# Patient Record
Sex: Male | Born: 1945 | Race: White | Hispanic: No | Marital: Married | Smoking: Never smoker
Health system: Southern US, Community
[De-identification: ages and names within clinical notes are randomized; demographics above are authoritative.]

## PROBLEM LIST (undated history)

## (undated) HISTORY — PX: TOTAL HIP ARTHROPLASTY: SHX124

---

## 2001-08-14 ENCOUNTER — Encounter: Payer: Self-pay | Admitting: Orthopedic Surgery

## 2001-08-19 ENCOUNTER — Encounter: Payer: Self-pay | Admitting: Orthopedic Surgery

## 2001-08-19 ENCOUNTER — Inpatient Hospital Stay (HOSPITAL_COMMUNITY): Admission: RE | Admit: 2001-08-19 | Discharge: 2001-08-23 | Payer: Self-pay | Admitting: Orthopedic Surgery

## 2004-12-19 ENCOUNTER — Ambulatory Visit (HOSPITAL_COMMUNITY): Admission: RE | Admit: 2004-12-19 | Discharge: 2004-12-19 | Payer: Self-pay | Admitting: *Deleted

## 2006-06-01 ENCOUNTER — Emergency Department (HOSPITAL_COMMUNITY): Admission: EM | Admit: 2006-06-01 | Discharge: 2006-06-01 | Payer: Self-pay | Admitting: Emergency Medicine

## 2007-02-19 ENCOUNTER — Ambulatory Visit: Payer: Self-pay | Admitting: Cardiology

## 2007-02-19 ENCOUNTER — Observation Stay (HOSPITAL_COMMUNITY): Admission: EM | Admit: 2007-02-19 | Discharge: 2007-02-20 | Payer: Self-pay | Admitting: Emergency Medicine

## 2007-02-20 ENCOUNTER — Ambulatory Visit: Payer: Self-pay

## 2007-02-20 ENCOUNTER — Encounter (INDEPENDENT_AMBULATORY_CARE_PROVIDER_SITE_OTHER): Payer: Self-pay | Admitting: Internal Medicine

## 2009-07-05 ENCOUNTER — Inpatient Hospital Stay (HOSPITAL_COMMUNITY): Admission: RE | Admit: 2009-07-05 | Discharge: 2009-07-09 | Payer: Self-pay | Admitting: Orthopedic Surgery

## 2009-07-05 ENCOUNTER — Ambulatory Visit: Payer: Self-pay | Admitting: Cardiology

## 2009-07-08 ENCOUNTER — Encounter: Payer: Self-pay | Admitting: Cardiology

## 2010-08-28 LAB — CBC
HCT: 30.5 % — ABNORMAL LOW (ref 39.0–52.0)
HCT: 31.7 % — ABNORMAL LOW (ref 39.0–52.0)
HCT: 39.1 % (ref 39.0–52.0)
Hemoglobin: 10.5 g/dL — ABNORMAL LOW (ref 13.0–17.0)
Hemoglobin: 10.6 g/dL — ABNORMAL LOW (ref 13.0–17.0)
MCHC: 33.2 g/dL (ref 30.0–36.0)
MCHC: 33.8 g/dL (ref 30.0–36.0)
MCV: 87.5 fL (ref 78.0–100.0)
MCV: 87.7 fL (ref 78.0–100.0)
MCV: 88 fL (ref 78.0–100.0)
MCV: 88.2 fL (ref 78.0–100.0)
Platelets: 221 10*3/uL (ref 150–400)
RBC: 3.49 MIL/uL — ABNORMAL LOW (ref 4.22–5.81)
RBC: 3.6 MIL/uL — ABNORMAL LOW (ref 4.22–5.81)
RBC: 4.45 MIL/uL (ref 4.22–5.81)
RDW: 15.5 % (ref 11.5–15.5)
WBC: 5.5 10*3/uL (ref 4.0–10.5)
WBC: 7.1 10*3/uL (ref 4.0–10.5)

## 2010-08-28 LAB — BASIC METABOLIC PANEL
CO2: 29 mEq/L (ref 19–32)
CO2: 30 mEq/L (ref 19–32)
Chloride: 101 mEq/L (ref 96–112)
Chloride: 102 mEq/L (ref 96–112)
Creatinine, Ser: 1.09 mg/dL (ref 0.4–1.5)
GFR calc Af Amer: >60 mL/min (ref >60)
GFR calc non Af Amer: >60 mL/min (ref >60)
Glucose, Bld: 109 mg/dL — ABNORMAL HIGH (ref 70–99)
Glucose, Bld: 126 mg/dL — ABNORMAL HIGH (ref 70–99)
Potassium: 4.1 mEq/L (ref 3.5–5.1)
Sodium: 138 mEq/L (ref 135–145)

## 2010-08-28 LAB — PROTIME-INR
INR: 1.07 (ref 0.00–1.49)
Prothrombin Time: 17.3 seconds — ABNORMAL HIGH (ref 11.6–15.2)
Prothrombin Time: 26.1 seconds — ABNORMAL HIGH (ref 11.6–15.2)

## 2010-08-28 LAB — URINALYSIS, ROUTINE W REFLEX MICROSCOPIC
Bilirubin Urine: NEGATIVE
Ketones, ur: NEGATIVE mg/dL
Nitrite: NEGATIVE
Protein, ur: NEGATIVE mg/dL
Urobilinogen, UA: 1 mg/dL (ref 0.0–1.0)

## 2010-08-28 LAB — TYPE AND SCREEN: Antibody Screen: NEGATIVE

## 2010-08-28 LAB — COMPREHENSIVE METABOLIC PANEL
BUN: 16 mg/dL (ref 6–23)
CO2: 31 mEq/L (ref 19–32)
Calcium: 9.1 mg/dL (ref 8.4–10.5)
Chloride: 103 mEq/L (ref 96–112)
Creatinine, Ser: 1.09 mg/dL (ref 0.4–1.5)
GFR calc Af Amer: >60 mL/min (ref >60)
GFR calc non Af Amer: >60 mL/min (ref >60)
Total Bilirubin: 0.6 mg/dL (ref 0.3–1.2)

## 2010-08-28 LAB — APTT: aPTT: 28 seconds (ref 24–37)

## 2010-10-25 NOTE — H&P (Signed)
NAME:  Shawn Armstrong, Shawn Armstrong NO.:  [id number]   MEDICAL RECORD NO.:  [id number]          PATIENT TYPE:  EMS   LOCATION:  MAJO                         FACILITY:  [facility]   PHYSICIAN:  Ladell Pier, M.D.   DATE OF BIRTH:  02/29/1946   DATE OF ADMISSION:  02/19/2007  DATE OF DISCHARGE:                              HISTORY & PHYSICAL   CHIEF COMPLAINT:  Chest pain.   HISTORY OF PRESENT ILLNESS:  The patient is 65 year old white male that  presented to the emergency room with complaints of substernal chest pain  for the past 3 to 4 days.  He thinks the pain may radiate down his left  arm, but he is not so sure, as he also has problems with his left  rotator cuff.  No nausea or vomiting.  No diaphoresis.  No shortness of  breath.  He is not sure if it is related to food or activity.  He was  lifting some medium size weights recently, and not sure if it is related  to that.  Pain also varies with inspiration.   PAST MEDICAL HISTORY:  1. Significant for polio as a child without any subsequent sequelae.  2. Hypertension.  3. Osteoarthritis.  4. History of a left hip replacement.  5. 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  69. History of severe UTI several years ago requiring hospitalization.  7. 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   FAMILY HISTORY:  Father died at 56 with hypertension and lung cancer.  Mother is in her 18s with a history of colonic polyps.  Four brothers  and four sisters, one brother with a heart attack at age 27 years old.   SOCIAL HISTORY:  The patient has no children.  He denies any tobacco or  alcohol use.  He is married.  He works in a Naval architect at [employer].  He lives in a 2-level-story house.   MEDICATIONS:  Aceon and bisoprolol; not sure of the doses.   ALLERGIES:  No known drug allergies.   REVIEW OF SYSTEMS:  As previously stated in the HPI.   PHYSICAL EXAMINATION:  VITAL SIGNS:  Temperature  97.2, pulse 51,  respirations 20, blood pressure 130/84, pulse ox 100% on room air.  HEENT:  Head is normocephalic, atraumatic.  Pupils reactive to light.  Throat without erythema.  CARDIOVASCULAR:  Regular rate and rhythm.  No murmurs, rubs or gallops.  ABDOMEN:  Soft, nontender, nondistended.  Positive bowel sounds.  EXTREMITIES:  No edema.   LABORATORY DATA:  WBC 5.9, hemoglobin 12.4, platelets 207, MCV 90.  Cardiac enzymes negative so far.  The EKG shows a mild sinus brady.  CT  of the chest and abdomen negative.  Sodium 138, potassium 4.2, chloride  109, CO2 of 26.6, BUN 40, creatinine 1.2, glucose 95.   ASSESSMENT AND PLAN:  1. Atypical chest pain:  This pain is very atypical.  It could be      musculoskeletal, it could be gastrointestinal related, but with his  age and hypertension, it could also be cardiac related, but it      sounds pleuritic in nature.  We will, however, get cardiology to      see him and possibly do a stress test.  Could be done inpatient or      outpatient.  2. Hypertension:  Continue home medications.  3. Obesity:  Diet and exercise.  4. Anemia:  We will get an anemia panel and guaiac stools.      Ladell Pier, M.D.  Electronically Signed     NJ/MEDQ  D:  02/19/2007  T:  02/19/2007  Job:  [id number]   cc:   Massie Maroon, MD

## 2010-10-25 NOTE — Consult Note (Signed)
NAME:  Shawn, Armstrong NO.:  [id number]   MEDICAL RECORD NO.:  [id number]          PATIENT TYPE:  INP   LOCATION:  4729                         FACILITY:  [facility]   PHYSICIAN:  Jesse Sans. Wall, MD, FACCDATE OF BIRTH:  [birth year]   DATE OF CONSULTATION:  02/19/2007  DATE OF DISCHARGE:                                 CONSULTATION   HISTORY:  Shawn Armstrong is a 65 year old white male who was transported via  EMS from work to [facility] Emergency Room secondary to chest  discomfort.  He is being admitted by [facility], and we are asked to  consult.   Shawn Armstrong describes at least a 1 week history of anterior, dull,  pleuritic discomfort occurring in his chest.  This rarely occurs with  exertion, it usually lasts less than 5 minutes.  It does not radiate,  nor is it associated with nausea, vomiting, shortness of breath or  diaphoresis.  He gives it a 4 on a scale of zero to 10.  It is not clear  how many episodes he has had in the last week.  However, last night he  had an episode that he felt was secondary to over-eating, and he took  some antacid with some improvement.  He stated that he had a large  amount of lasagna.  These symptoms that he experienced last night are  typical for him when he over-eats.  However, this morning at work he had  the same symptoms.  An EMT checked his blood pressure, and it was  140/100, which he felt was unusually high for him.  His supervisor  called EMS, and they transported him to [facility] Emergency Room.  EMS  report is not available.   The patient also describes left arm and hand numbness that occurs while  he is sleeping at night, and it is noticeable when he wakes up, however  slowly works itself out.  He has not had this evaluated by his  orthopaedic doctor, Dr. Lequita Halt, or his primary care physician.  He also  notes difficulty raising his left arm above his head, however he denies  any of these symptoms associated with chest  discomfort.   ALLERGIES:  No known drug allergies.   MEDICATIONS PRIOR TO ADMISSION:  Include Aceon and __________ daily,  unknown dosages.  His wife is to be bringing these medications in.  He  also takes saw palmetto and Advil p.r.n.   PAST MEDICAL HISTORY:  Notable for GERD with negative EGD and  colonoscopy on December 19, 2004.  Osteoarthritis.  Hypertension.  At home  his blood pressure usually runs in the 120s/70s.  Polio as a child,  spinal meningitis at age 69.  Prior stress test in 2007 by his primary  care physician, and it was deemed okay.  Status post left total hip  arthroplasty in March 2003.  Second right toe amputation secondary to  trauma in 1974.   SOCIAL HISTORY:  He resides in [place] with his wife.  He does not  have children.  He works for [employer] in [employer].  He denies  any tobacco, alcohol, drug, specific diet.  He does take saw palmetto as  an herbal medication.  He does stretches and light weights.  He has been  avoiding push-ups secondary to his left shoulder.   FAMILY HISTORY:  His mother is alive at age 68, with possible diagnosis  of diabetes, history of hip fracture and colon polyps.  His father died  at age 84 of lung cancer and hypertensions.  He has 4 brothers and 4  brothers.  One brother had an MI at age 61.  He has one sister who has  had a bypass surgery at age 59, but he also relates excessive drug use  earlier in her life.   REVIEW OF SYSTEMS:  In addition to the above, notable for reading  glasses, upper partial plate, lower caps.  Right hip arthralgias.  GERD,  which he describes as a bloated feeling and positive snoring.   PHYSICAL EXAMINATION:  (Performed by Dr. Daleen Squibb).  VITAL SIGNS:  Temperature 97.2, blood pressure 130/84, pulse 51 and  regular, respirations 20 and regular.  Saturation 100% on 4 liters.  HEENT:  Unremarkable.  NECK:  Supple, without thyromegaly, JVD, adenopathy or carotid bruits.  CHEST:  Symmetrical  excursion.  Lung sounds were clear to auscultation  without rales, rhonchi, or wheezing.  HEART:  PMI is not displaced.  Regular rate and rhythm.  Normal S1 and  S2, without murmurs, rubs, clicks or gallops.  All pulses are  symmetrical and intact, without abdominal or femoral bruits.  SKIN:  Integument is intact.  ABDOMEN:  Obese.  Bowel sounds present, without organomegaly, masses or  tenderness.  EXTREMITIES:  Negative cyanosis, clubbing or edema.  MUSCULOSKELETAL:  Grossly unremarkable.  NEUROLOGIC:  Unremarkable.   Chest x-ray shows a prominent ascending aorta.  Chest, abdominal CT did  not show any evidence of pulmonary embolism, thoracic or abdominal  aortic dissection.  EKG showed sinus bradycardia, normal axis, early R-  wave, nonspecific ST-T wave changes, normal intervals, ventricular rate  51.  Old EKG is not available.  H and H 12.4 and 36.2, normal indices.  Platelets 207.  WBCs 5.9.  An I-stat showed a sodium of 138, potassium  4.2, BUN 14, creatinine 1.2, glucose 95.  Point of care markers were  negative x2.   IMPRESSION:  1. Atypical chest discomfort, doubt cardiac etiology.  He has had      negative point of care markers x2.  His EKG and CT scans are      unremarkable.  2. Hypertension.  History is noted per past medical history.  3. Probable left rotator cuff injury.   PLAN:  Agree with your admission to rule out myocardial infarction.  If  enzymes are negative for myocardial infarction, stress Myoview will be  performed in our office tomorrow on [facility] on February 20, 2007  at 11:30.  If his enzymes are abnormal, we will arrange cardiac  catheterization.  We will order fasting lipids and CMET to assess his  lipid status.  Consider outpatient orthopaedic referral with his left  shoulder problems.      Joellyn Rued, PA-C      Jesse Sans. Daleen Squibb, MD, [facility]  Electronically Signed    EW/MEDQ  D:  02/19/2007  T:  02/19/2007  Job:  [id number]   cc:   Massie Maroon, MD  Jesse Sans Daleen Squibb, MD, [facility]  Georgiana Spinner, M.D.  Ollen Gross, M.D.

## 2010-10-25 NOTE — Discharge Summary (Signed)
NAME:  KASCH, BORQUEZ NO.:  [id number]   MEDICAL RECORD NO.:  [id number]          PATIENT TYPE:  INP   LOCATION:  4729                         FACILITY:  [facility]   PHYSICIAN:  Altha Harm, MDDATE OF BIRTH:  1946/02/17   DATE OF ADMISSION:  02/19/2007  DATE OF DISCHARGE:  02/20/2007                               DISCHARGE SUMMARY   DISCHARGE DISPOSITION:  To the care of cardiologist outpatient for  outpatient stress test, and then cardiology will determine sign-out  disposition for patient.   FINAL DISCHARGE DIAGNOSES:  1. Chest pain.  2. Bradycardia.  Asymptomatic.  3. Borderline hyperlipidemia.  4. History of polio as a child without any subsequent sequela.  5. History of hypertension.  6. History of osteoarthritis.  7. History of left hip replacement.  8. Status post amputation of second right toe from trauma in 1974.   DISCHARGE MEDICATIONS:  1. Aceon 4 milligrams orally daily.  2. Saw palmetto 1 tablet orally daily.   NEW MEDICATIONS:  1. Aspirin 81 milligrams orally daily.  2. Nitroglycerin 0.4 milligrams sublingual every 5 mins  x 3 as      needed.   DISCONTINUED MEDICATIONS:  Bisoprolol 5 milligrams orally daily.   CONSULTATIONS:  [place] cardiology.   PROCEDURE:  None.   DIAGNOSTIC STUDIES:  1. A 2D echocardiogram.  Results pending.  2. CT angiogram of the abdomen and CT angiogram of the chest which      show no acute abdominal aortic dissection and no aneurysm.  3. Chest x-ray  which shows prominence in the right hilar region which      may be related to the offending aorta.   CODE STATUS:  Full code.   ALLERGIES:  No known drug allergies.   PRIMARY CARE PHYSICIAN:  Dr. Selena Batten.   CHIEF COMPLAINT:  Chest pain.   HISTORY OF PRESENT ILLNESS:  Please see history and physical dictated by  Dr. Olena Leatherwood for details of the history of present illness.   HOSPITAL COURSE:  The patient was ruled out for resting ischemia wih  serial   enzymes.  Patient was seen by cardiology who recommended that  the patient have a stress test as an outpatient.  The patient was also  noted to have bradycardia with heart rates in the 40s and 50s, but had  no associated hypotension and was ambulating without any difficulty.  The patient was seen by cardiology, and this was actually discussed by  myself with the cardiologist representative Irving Burton  , who states that in  discussion with her cardiologist, it was determined that the patient  could be discharged without waiting to have the stress test done in the  hospital, and that during the stress test, the patient's chronotropic  effects would be evaluated at that time.  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  The patient  was on a small dose of 5 milligrams.  Further treatment of bradycardia  will be determined by cardiologist after evaluation of his response to  activity during the stress test.  The  patient is otherwise stable, and  the plan is for discharge to the care of the cardiologist in the  outpatient setting for the stress Myoview.      Altha Harm, MD  Electronically Signed     MAM/MEDQ  D:  02/20/2007  T:  02/20/2007  Job:  [id number]   cc:   Massie Maroon, MD

## 2010-10-28 NOTE — Op Note (Signed)
NAME:  AMARO, MANGOLD NO.:  [id number]   MEDICAL RECORD NO.:  [id number]          PATIENT TYPE:  AMB   LOCATION:  ENDO                         [facility]ACILITY:  [facility]   PHYSICIAN:  Georgiana Spinner, M.D.    DATE OF BIRTH:  [birth year]   DATE OF PROCEDURE:  12/19/2004  DATE OF DISCHARGE:                                 OPERATIVE REPORT   PROCEDURE:  Colonoscopy.   INDICATIONS:  Colon cancer screening.   ANESTHESIA:  Demerol 20 mg and Versed 1 mg.   DESCRIPTION OF PROCEDURE:  With the patient mildly sedated in the left  lateral decubitus position, a rectal examination was performed which was  unremarkable. Subsequently the Olympus videoscopic colonoscope was inserted  into the rectum and passed under direct vision to the cecum identified by  ileocecal valve and crow's foot of the cecum. From this point the  colonoscope was slowly withdrawn, taking circumferential views of colonic  mucosa, stopping only in the rectum which appeared normal on direct and  retroflexed view. The endoscope was straightened and withdrawn. The  patient's vital signs and pulse oximeter remained stable. The patient  tolerated procedure well without apparent complication.   FINDINGS:  Unremarkable colonoscopic examination to the cecum.   PLAN:  Consider repeat examination possibly in 5-10 years       GMO/MEDQ  D:  12/19/2004  T:  12/19/2004  Job:  [id number]

## 2010-10-28 NOTE — Op Note (Signed)
[facility]  Patient:    Shawn Armstrong, Shawn Armstrong Visit Number: [id number] MRN: [id number]          Service Type: SUR Location: 4W [id number] [id number] Attending Physician:  Loanne Drilling Dictated by:   Ollen Gross, M.D. Proc. Date: 08/20/01 Admit Date:  08/19/2001                             Operative Report  PREOPERATIVE DIAGNOSIS: Osteoarthritis of the left hip.  POSTOPERATIVE DIAGNOSIS:  Osteoarthritis of the left hip.  PROCEDURE:  Left total hip arthroplasty.  SURGEON:  Ollen Gross, M.D.  ASSISTANT:  Ralene Bathe, P.A.  ANESTHESIA:  General.  ESTIMATED BLOOD LOSS:  350  DRAINS:  Hemovac x1.  COMPLICATIONS:  None.  CONDITION:  Stable to recovery.  BRIEF CLINICAL NOTE:  Shawn Armstrong is a 65 year old male with severe osteoarthritis of the left hip with pain refractory to nonoperative management. He presents now for left total hip arthroplasty.  DESCRIPTION OF PROCEDURE:  After successful administration of general anesthetic, the patient was placed in the right lateral decubitus position with the left hand side up and held with the hip positioner. The left lower extremity was isolated from his perineum with plastic drapes and prepped and draped in the usual sterile fashion. A standard posterolateral incision was made with a 10 blade through the subcutaneous tissue to the level of the fascia lata which was incised in line with the skin incision. The sciatic nerve was palpated and protected and short external rotators isolated off the femur. Capsulectomy is then performed. The hip was dislocated and there was tremendous osteophyte formation. The center of the femoral head is marked and trial prosthesis placed such that the center of the trial head corresponds with the center of his native femoral head. Osteotomy is made with an oscillating saw. The femoral head is removed. The femur is retracted anteriorly and part of the anterior capsule removed.  Acetabular exposure was obtained.  Reaming starts at a 53 coursing in increments in 2 to a 57 mm. A 58 mm pinnacle acetabular shell is then impacted into the acetabulum in his native anatomic position. Its transfixed with dome screws. The 32 mm neutral trial liner was placed.  The femur was prepared first with the canal finder and then irrigation. Axial reaming is performed to 15.5 mm. Proximal reaming is performed up to a 3F and the sleeve machine to a large. A trial 3F large sleeve is placed with the 20 x 15 stem, 36 plus 8 neck, and 32 plus 0 head. To match his native anteversion, trial reduction is performed, full extension and full external rotation achieved with 70 degrees flexion and 40 degrees adduction, 90 degrees internal rotation and 90 degrees flexion and 70 degrees internal rotation. The hip is dislocated and all trials removed. Apex hole eliminator is then placed into the acetabular shell and then the 32 mm neutral liner is placed into the shell. This was a marathon highly cross link polyethylene. The femur is then addressed with the 3F large sleeve, 20 x 15 stem with 36 plus 8 neck. The 32 plus zero head is placed. The hip is reduced with great stability throughout. The wound was copiously irrigated with antibiotic solution and short external rotators reattached to the femur through drill holes. The fascia lata was closed over a Hemovac drain with interrupted #1 Vicryl, subcu closed with #1 and 2-0 Vicryl, subcuticular running 4-0 monocryl.  The incision was cleaned and dried and Steri-Strips and a bulky sterile dressing applied. The patient was awakened and transported to recovery in stable condition. Dictated by:   Ollen Gross, M.D. Attending Physician:  Loanne Drilling DD:  08/19/01 TD:  08/19/01 Job: [id number]

## 2010-10-28 NOTE — Op Note (Signed)
NAME:  Shawn Armstrong, REIM NO.:  [id number]   MEDICAL RECORD NO.:  [id number]          PATIENT TYPE:  AMB   LOCATION:  ENDO                         FACILITY:  [facility]   PHYSICIAN:  Georgiana Spinner, M.D.    DATE OF BIRTH:  1946/02/07   DATE OF PROCEDURE:  12/19/2004  DATE OF DISCHARGE:                                 OPERATIVE REPORT   PROCEDURE:  Upper endoscopy.   INDICATIONS:  Gastroesophageal reflux disease.   ANESTHESIA:  Demerol 50, Versed 6 mg.   DESCRIPTION OF PROCEDURE:  With the patient mildly sedated in the left  lateral decubitus position, the Olympus videoscopic endoscope was inserted  in the mouth and passed under direct vision through the esophagus which  appeared normal. There was no evidence of Barrett's. We entered into the  stomach. The fundus, body, antrum, duodenal bulb, second portion of duodenum  appeared normal. From this point, the endoscope was slowly withdrawn taking  circumferential views of the duodenal mucosa until the endoscope had been  pulled back into the stomach, placed in retroflexion to view the stomach  from below and an incomplete wrap of the GE junction was noted on this view.  The endoscope was straightened and withdrawn taking circumferential views of  the remaining gastric and esophageal mucosa. The patient's vital signs and  pulse oximeter remained stable. The patient tolerated the procedure well  without apparent complications.   FINDINGS:  Loose wrap of the GE junction around the endoscope indicating  laxity of the sphincter, otherwise and unremarkable exam.   PLAN:  Proceed to colonoscopy.       GMO/MEDQ  D:  12/19/2004  T:  12/19/2004  Job:  [id number]

## 2010-10-28 NOTE — H&P (Signed)
[facility]  Patient:    Shawn Armstrong, Shawn Armstrong Visit Number: [id number] MRN: [id number]          Service Type: Attending:  Ollen Gross, M.D. Dictated by:   Dorie Rank, P.A. Adm. Date:  08/19/01   CC:         Shawn Armstrong. Shawn Armstrong., M.D.   History and Physical  DATE OF BIRTH:  1945/08/23  CHIEF COMPLAINT:  Left hip pain.  HISTORY OF PRESENT ILLNESS:  Shawn Armstrong is a pleasant 65 year old male who has a long history of left hip pain which has gotten progressively worse over the past several months.  The pain is in the groin, radiating in the medial thigh, and interferes with most activities of daily living.  It has even affected his ability to play golf.  On physical exam it was noted that the range of motion was limited.  He had flexion to 85 degrees, no internal rotation, only 10 degrees of external rotation, and 20 degrees of abduction.  Leg lengths were equal.  He walks with an abductor lurch in the office.  Radiographs revealed severe osteoarthritis to the left hip.  It was felt due to diagnostic studies as well as interference with activities of daily living and pain refractory to conservative treatment, he would benefit from undergoing a left total hip arthroplasty.  The risks and benefits as well as the procedure were discussed with the patient, and he elected to proceed.  ALLERGIES:  No known drug allergies.  MEDICATIONS: 1. Aceon 8 mg, 1 p.o. q.d. 2. ______ 5 mg, 1 p.o. q.d. 3. Tylenol p.r.n.  PAST MEDICAL HISTORY: 1. Polio as a child without any subsequent sequelae. 2. Hypertension. 3. Osteoarthritis. 4. History of spinal meningitis at age 64 without any complications after    that. 5. Severe UTI several years back which did require hospitalization.  PAST SURGICAL HISTORY:  Amputation of second right toe following trauma in 1974.  SOCIAL HISTORY:  The patient donated autologous blood for this.  He has no children.  He denies  any tobacco or alcohol use.  He is married.  He works in a Naval architect at [employer].  He lives in a two-level home.  He has a bedroom on the second floor; however, states he will place a bed on the first level. His wife will be available to care for him postoperatively.  FAMILY HISTORY:  Father deceased, age 26, history of hypertension and lung cancer.  Mother living, age 18, history of colon polyps.  REVIEW OF SYSTEMS:  GENERAL:  No fevers, chills, night sweats, or bleeding tendencies.  PULMONARY:  No shortness of breath, productive cough, or hemoptysis.  CARDIOVASCULAR:  No chest pain, angina, or orthopnea. NEUROLOGIC:  No seizures, headaches, or paralysis.  GASTROINTESTINAL:  No nausea, vomiting, diarrhea, constipation, or melena.  GENITOURINARY:  No hematuria, dysuria, or discharge.  MUSCULOSKELETAL:  Left hip pain and right epicondylitis.  PHYSICAL EXAMINATION:  GENERAL:  Alert and oriented x 3, well-developed, well-nourished 65 year old African-American male.  VITAL SIGNS:  Pulse 60, respirations 16, blood pressure 110/80.  HEENT:  He wears an upper partial.  Head atraumatic, normocephalic. Oropharynx is clear.  NECK:  Supple.  Negative for carotid bruits bilaterally.  Negative for cervical lymphadenopathy palpated on exam.  LUNGS:  Clear to auscultation bilaterally.  No wheezes, rhonchi, or rales.  BREASTS:  Not pertinent to present illness.  HEART:  S1, S2.  Negative for murmur, rub, or gallop.  Regular rate and  rhythm.  ABDOMEN:  Soft and nontender.  Positive bowel sounds.  GENITOURINARY:  Not pertinent to present illness.  EXTREMITIES:  Please see history of present illness for physical exam to the left hip.  SKIN:  Intact.  Distal pulses are 1+ and symmetrical bilaterally.  No rashes or lesions appreciated on exam.  LABORATORY DATA:  X-rays of the left hip in the office showed severe bone-on-bone contact changes with osteophyte  formations.  IMPRESSION: 1. Osteoarthritis left hip. 2. Hypertension. 3. Polio as a child.  PLAN:  The patient is scheduled for a ______ total hip arthroplasty with Dr. Ollen Gross. Dictated by:   Dorie Rank, P.A. Attending:  Ollen Gross, M.D. DD:  08/14/01 TD:  08/15/01 Job: [id number]

## 2010-10-28 NOTE — Discharge Summary (Signed)
[facility]  Patient:    Shawn Armstrong, Shawn Armstrong Visit Number: [id number] MRN: [id number]          Service Type: SUR Location: [id number] Attending Physician:  Loanne Drilling Dictated by:   Ottie Glazier Wynona Neat, P.A.-C. Admit Date:  08/19/2001 Discharge Date: 08/23/2001                             Discharge Summary  ADMITTING DIAGNOSES: 1. Left hip pain secondary to osteoarthritis. 2. History of hypertension. 3. History of osteoarthritis. 4. History of polio as a child without any subsequent sequelae. 5. 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 6. Previous urinary tract infection which required hospitalization.  DISCHARGE DIAGNOSES: 1. Left hip pain secondary to osteoarthritis, improved. 2. History of hypertension. 3. History of osteoarthritis. 4. History of polio as a child without any subsequent sequelae. 5. History of spinal meningitis at age 56 without any subsequent sequelae. 6. Previous urinary tract infection which required hospitalization.  CONSULTATIONS:  None.  HISTORY OF PRESENT ILLNESS:  Shawn Armstrong is a 65 year old, white male who had a long history of left hip pain which increased in frequency and duration.  He was seen and evaluated for Dr. Ollen Gross, [facility]. Despite conservation treatment, the patient still had debilitating and progressive pain that interfered with his activities of daily living.  Due to this chronic and ongoing pain, the patient opted to undergo surgical intervention.  Surgical intervention in the form of a left total hip arthroplasty.  LABORATORY DATA AND X-RAY FINDINGS:  Left hip x-rays in the office showed severe bone on bone contact changes with osteophyte formations.  Preoperative labs showed H&H 12.1 and 34.9.  PT/INR 13.8 and 1.1.  BMET within normal limits.  Liver functions within normal limits.  UA within normal limits.  He had blood type O  negative.  HOSPITAL COURSE:  On August 19, 2001, Mr. Dzikowski underwent a total hip arthroplasty per Dr. Ollen Gross with Ralene Bathe, P.A.-C assisting. Please see operative report for details.  On postop day #1, the patient stated that he was feeling tired especially with any bed to chair transfers, otherwise doing fairly well sitting up in a chair.  His wife was in the room with many questions concerning precautions that were discussed in great detail with the patient.  He was afebrile with vital signs stable, however, his blood pressure was 98/54.  His hemoglobin dropped from 12 to 10.9.  However, due to the patient being symptomatic and following Dr. Salina April recommendations, the patient was transfused two units of autologous blood.  Examination of his hip showed that he was neurovascularly intact.  His motor and sensory functions were intact.  The Hemovac was pulled without complications.  The patient was noted to have a drop in his sodium from 140 to 134.  Therefore, Lasix was given and his fluids were at [facility].  On postop day #2, the patient was awake and doing much better.  He stated he felt better after the transfusion having more energy, not feeling as tired and fatigued.  He denied any nausea or vomiting, no fever or chills.  Vital signs were stable.  He had a low-grade temperature of 99.2.  His H&H was 12.2 and 34.6.  INR was 1.7.  His dressing on the left hip was changed.  It was clean, dry and intact.  The calves were soft and nontender with  positive pulses.  His motor functioning was intact as well.  PCA and IV fluids were discontinued at the time.  The patient will continue with physical therapy and per their recommendations, home health PT would be sought per discharge planning.  On postop day #3, the patient was doing well.  His vital signs were stable and blood pressure was 99/52.  However, his blood pressure at the time of admission was 104/58.  He was asymptomatic.  He  was noted to be on two hypertensive medications.  His Aceon would be held for that day.  His temperature was 99.7, H&H 11.4 and 31.9.  His INR was 2.8.  Examination of the hip was within normal limits.  There were no signs or symptoms of infection. his motor and sensory functions were intact.  He was also given Protonix on postop day #3 for complaints of heartburn.  On postop day #4, the patient was resting in the bed reading the paper.  He had no complaints.  His wife was in the room and he was eager for discharge. His vital signs were stable and afebrile.  PT/INR was therapeutic at 21.4 and 2.3.  Left hip incision was clean, dry and intact.  Motor and sensory exam was intact.  CONDITION ON DISCHARGE:  Improved.  DIET:  Regular.  ACTIVITY:  Touchdown weightbearing to the left lower extremity.  Total hip precautions.  WOUND CARE:  Keep dressings clean, dry and intact.  SPECIAL INSTRUCTIONS:  The patient will be set up with Genevieve Norlander for home health services, physical therapy and Coumadin management.  DISCHARGE MEDICATIONS: 1. Percocet 5/325 one to two p.o. q.4-6h. p.r.n., #40. 2. Ambien 25 mg one p.o. q.4-6h., #40. 3. Robaxin 500 mg p.o. q.6-8h. p.r.n., #40. 4. Coumadin per pharmacy doses.  FOLLOWUP:  The patient will follow up with Dr. Lequita Halt in 10-14 days.  He is to call [phone], for appointment, questions or concerns. Dictated by:   Ottie Glazier. Wynona Neat, P.A.-C. Attending Physician:  Loanne Drilling DD:  08/27/01 TD:  08/28/01 Job: [id number]

## 2011-03-24 LAB — CBC
HCT: 34.5 — ABNORMAL LOW
Hemoglobin: 11.9 — ABNORMAL LOW
Hemoglobin: 12.4 — ABNORMAL LOW
MCHC: 34.3
MCHC: 34.3
Platelets: 211
RBC: 4.02 — ABNORMAL LOW
RDW: 13.7
WBC: 5.9

## 2011-03-24 LAB — POCT CARDIAC MARKERS
CKMB, poc: <1 — ABNORMAL LOW
Myoglobin, poc: 77.1
Troponin i, poc: <0.05
Troponin i, poc: <0.05

## 2011-03-24 LAB — VITAMIN B12: Vitamin B-12: 371 (ref 211–911)

## 2011-03-24 LAB — DIFFERENTIAL
Basophils Relative: 0
Lymphs Abs: 1.5
Monocytes Absolute: 0.4
Monocytes Relative: 7
Neutro Abs: 3.9

## 2011-03-24 LAB — COMPREHENSIVE METABOLIC PANEL
Albumin: 3.3 — ABNORMAL LOW
Alkaline Phosphatase: 84
BUN: 12
Calcium: 8.6
Glucose, Bld: 80
Potassium: 3.9
Sodium: 138
Total Protein: 6.3

## 2011-03-24 LAB — TSH: TSH: 1.267

## 2011-03-24 LAB — CARDIAC PANEL(CRET KIN+CKTOT+MB+TROPI): Total CK: 83

## 2011-03-24 LAB — LIPID PANEL
HDL: 28 — ABNORMAL LOW
LDL Cholesterol: 114 — ABNORMAL HIGH
Triglycerides: 141
VLDL: 28

## 2011-03-24 LAB — I-STAT 8, (EC8 V) (CONVERTED LAB)
Acid-Base Excess: 1
Bicarbonate: 26.6 — ABNORMAL HIGH
Glucose, Bld: 95
HCT: 39
Hemoglobin: 13.3
Operator id: 288831
Potassium: 4.2
Sodium: 138
TCO2: 28

## 2011-03-24 LAB — CK TOTAL AND CKMB (NOT AT ARMC)
Relative Index: INVALID
Total CK: 73

## 2011-03-24 LAB — FOLATE: Folate: 10.5

## 2011-03-24 LAB — IRON AND TIBC
Iron: 105
TIBC: 323
UIBC: 218

## 2011-03-24 LAB — TROPONIN I: Troponin I: 0.03

## 2011-03-24 LAB — FERRITIN: Ferritin: 12 — ABNORMAL LOW (ref 22–322)

## 2011-05-11 IMAGING — CR DG PORTABLE PELVIS
1 series · 1 of 1 positions shown · non-contrast
Comparison: 06/30/2009

CLINICAL DATA: Postop right total hip arthroplasty.

PORTABLE PELVIS

[view not recorded]
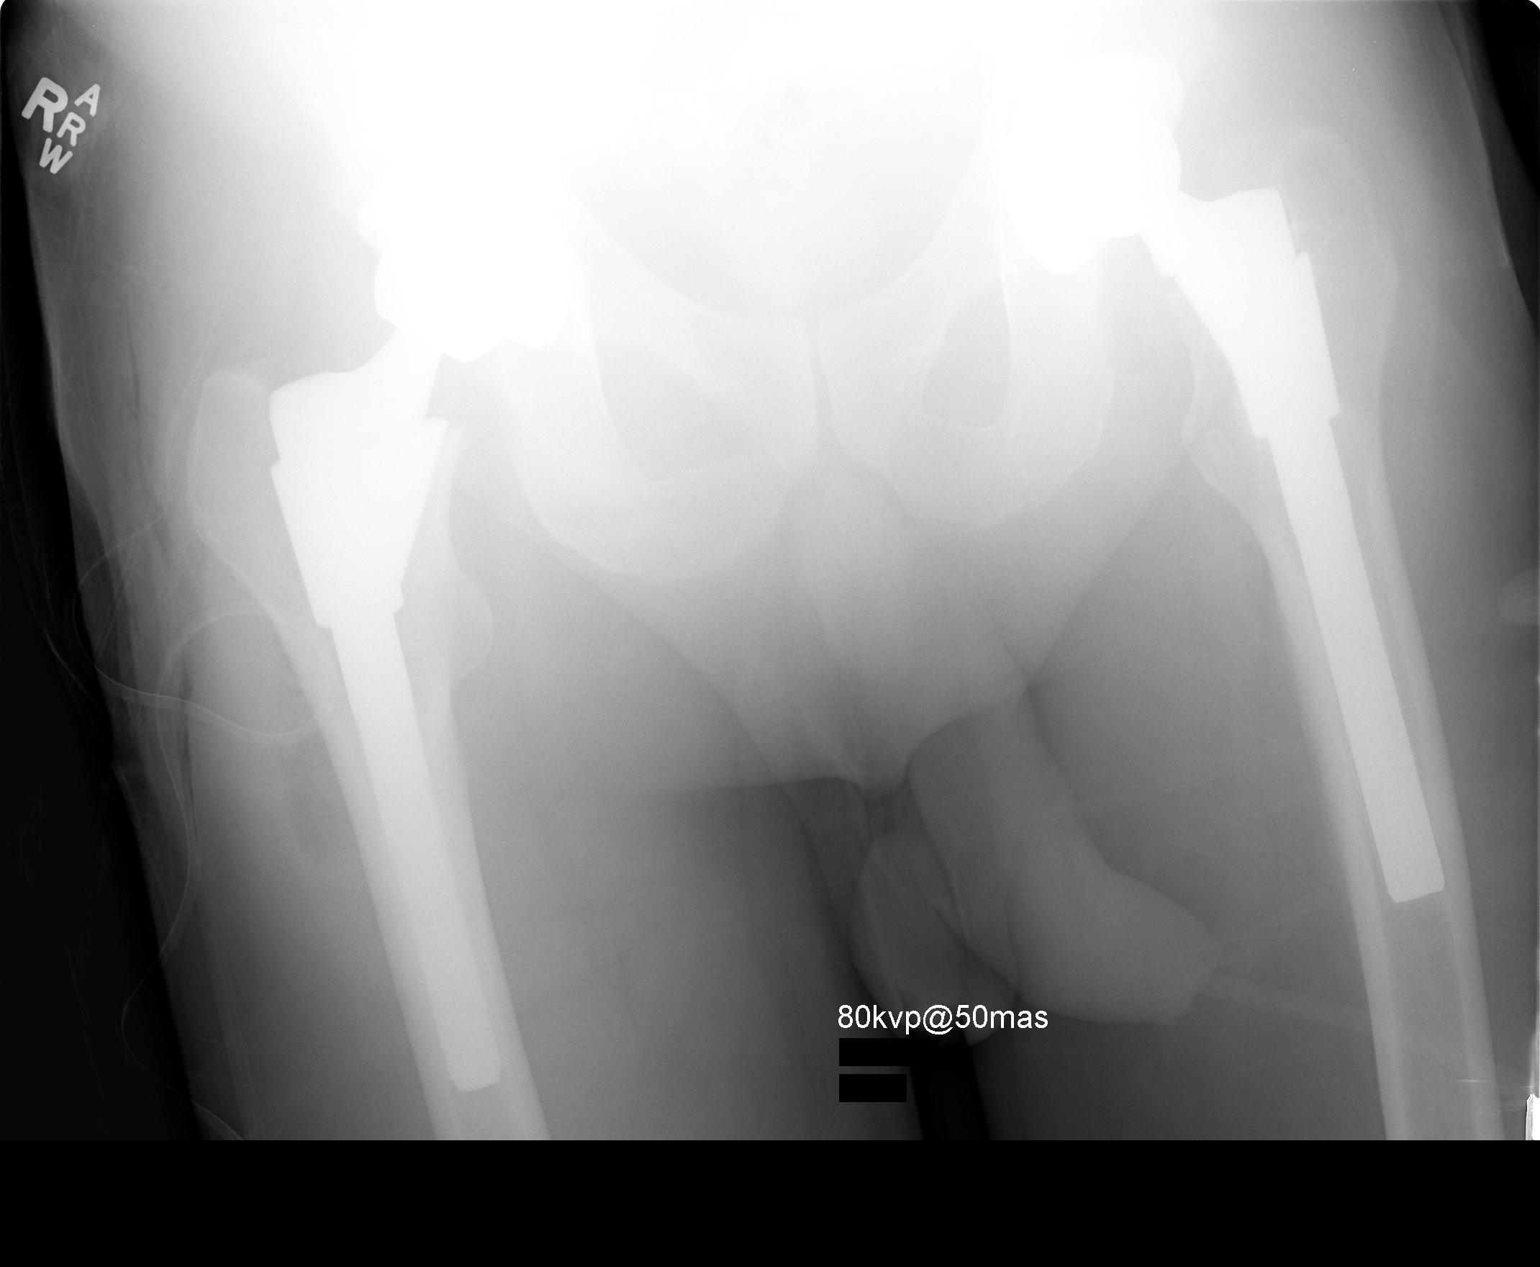

[1 of 1 positions shown; findings below may reference images not displayed]

FINDINGS: There has been interval right total hip arthroplasty.
Surgical drain is in place and there is associated subcutaneous
air.  Previous left total hip arthroplasty.
IMPRESSION: Interval right total hip arthroplasty with expected postoperative
findings.

## 2011-05-14 IMAGING — CR DG CHEST 1V PORT
1 series · 1 of 1 positions shown · non-contrast
Comparison: 06/30/2009.  CT of 1 day prior.

CLINICAL DATA: Postop for right hip replacement.  Congestion and
cough.

PORTABLE CHEST - 1 VIEW

[view not recorded]
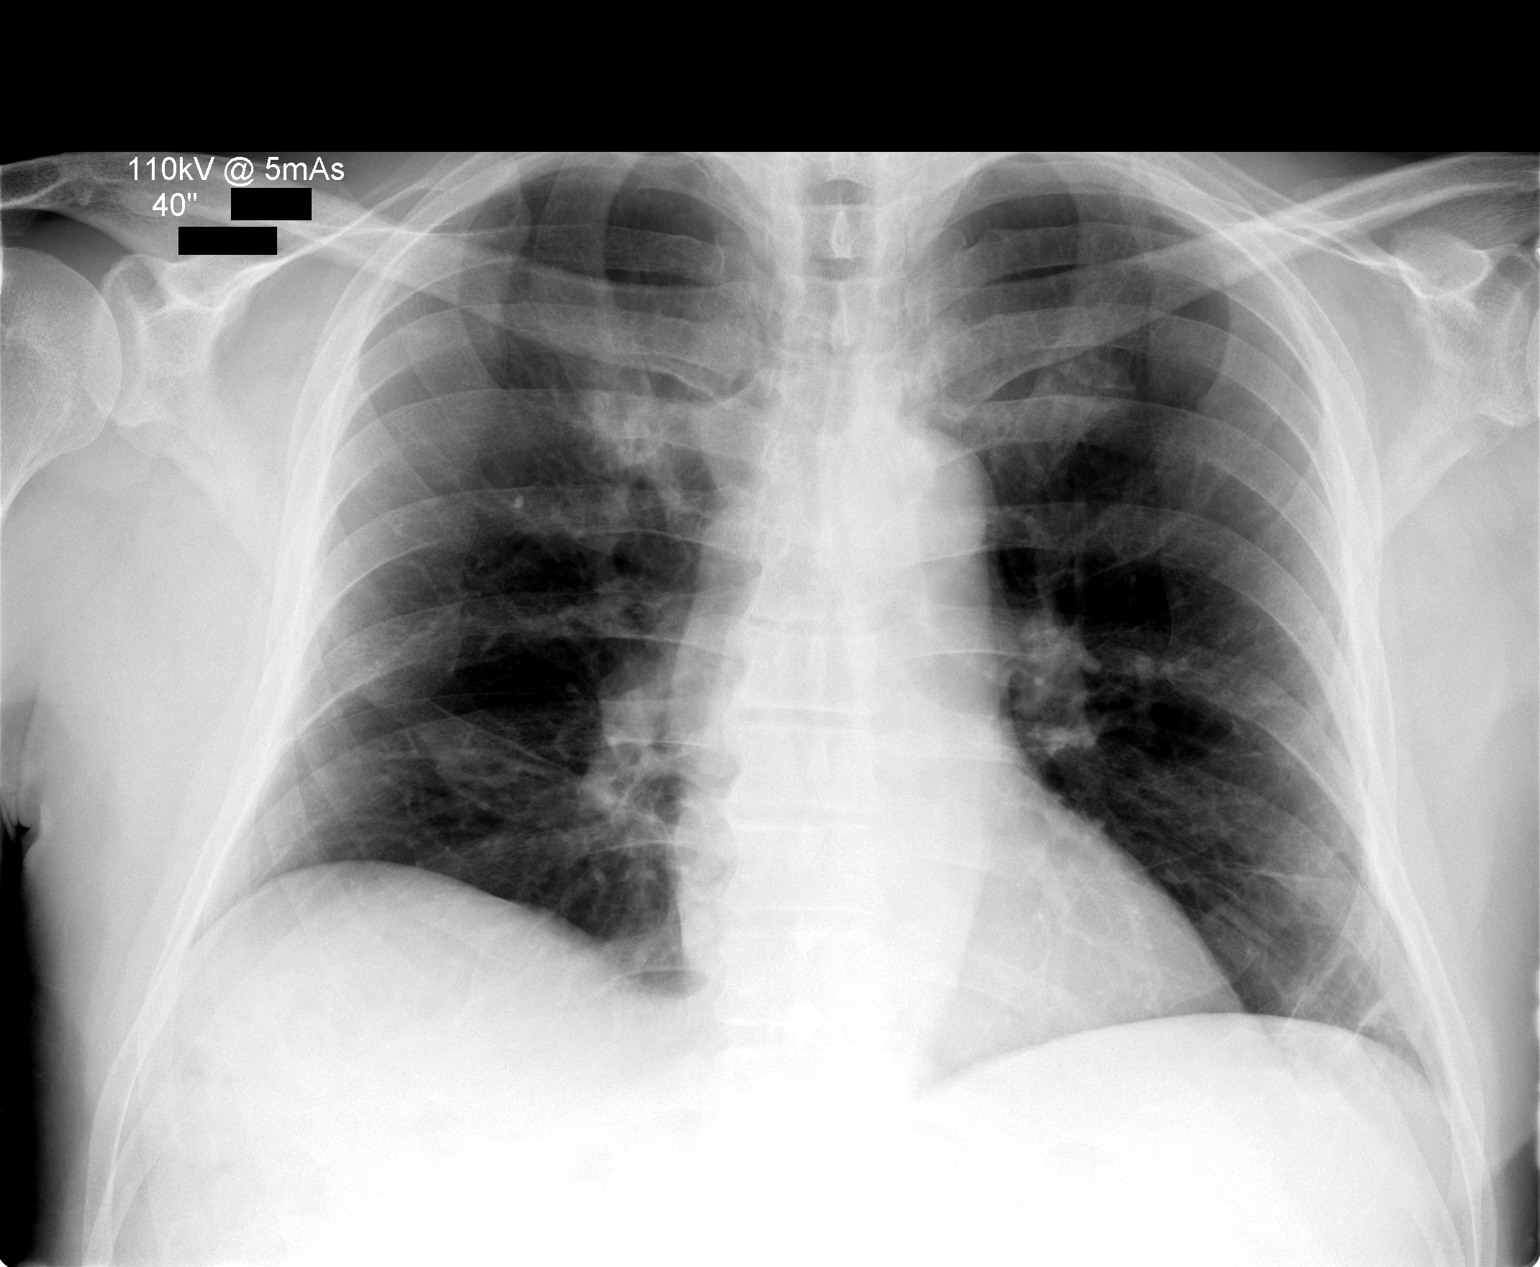

[1 of 1 positions shown; findings below may reference images not displayed]

FINDINGS: Midline trachea.  Normal heart size and mediastinal
contours. No pleural effusion or pneumothorax.  Bibasilar
atelectasis is mild. No congestive failure.
IMPRESSION: Mild bibasilar atelectasis without acute disease.

## 2019-07-17 ENCOUNTER — Ambulatory Visit: Payer: Medicare Other | Attending: Internal Medicine

## 2019-07-17 DIAGNOSIS — U071 COVID-19: Secondary | ICD-10-CM | POA: Insufficient documentation

## 2019-07-17 DIAGNOSIS — Z20822 Contact with and (suspected) exposure to covid-19: Secondary | ICD-10-CM

## 2019-07-18 LAB — NOVEL CORONAVIRUS, NAA: SARS-CoV-2, NAA: DETECTED — AB

## 2019-07-19 ENCOUNTER — Other Ambulatory Visit: Payer: Self-pay | Admitting: Adult Health

## 2019-07-19 NOTE — Progress Notes (Signed)
  I connected by phone with Stephens November on 07/19/2019 at 10:53 AM to discuss the potential use of an new treatment for mild to moderate COVID-19 viral infection in non-hospitalized patients.  This patient is a 74 y.o. male that meets the FDA criteria for Emergency Use Authorization of bamlanivimab or casirivimab\imdevimab.  Has a (+) direct SARS-CoV-2 viral test result  Has mild or moderate COVID-19   Is ? 72 years of age and weighs ? 40 kg  Is NOT hospitalized due to COVID-19  Is NOT requiring oxygen therapy or requiring an increase in baseline oxygen flow rate due to COVID-19  Is within 10 days of symptom onset  Has at least one of the high risk factor(s) for progression to severe COVID-19 and/or hospitalization as defined in EUA.  Specific high risk criteria : >/= 72 yo   I have spoken and communicated the following to the patient or parent/caregiver:  1. FDA has authorized the emergency use of bamlanivimab and casirivimab\imdevimab for the treatment of mild to moderate COVID-19 in adults and pediatric patients with positive results of direct SARS-CoV-2 viral testing who are 75 years of age and older weighing at least 40 kg, and who are at high risk for progressing to severe COVID-19 and/or hospitalization.  2. The significant known and potential risks and benefits of bamlanivimab and casirivimab\imdevimab, and the extent to which such potential risks and benefits are unknown.  3. Information on available alternative treatments and the risks and benefits of those alternatives, including clinical trials.  4. Patients treated with bamlanivimab and casirivimab\imdevimab should continue to self-isolate and use infection control measures (e.g., wear mask, isolate, social distance, avoid sharing personal items, clean and disinfect "high touch" surfaces, and frequent handwashing) according to CDC guidelines.   5. The patient or parent/caregiver has the option to accept or refuse  bamlanivimab or casirivimab\imdevimab .  After reviewing this information with the patient, The patient agreed to proceed with receiving the casirivimab\imdevimab infusion and will be provided a copy of the Fact sheet prior to receiving the infusion.   Scheduled 07/21/19 at 1030.  Marland Kitchen Rebeckah Masih 07/19/2019 10:53 AM

## 2019-07-21 ENCOUNTER — Ambulatory Visit (HOSPITAL_COMMUNITY)
Admission: RE | Admit: 2019-07-21 | Discharge: 2019-07-21 | Disposition: A | Discharge status: Home / Self Care | Payer: Medicare Other | Source: Ambulatory Visit | Attending: Pulmonary Disease | Admitting: Pulmonary Disease

## 2019-07-21 DIAGNOSIS — Z23 Encounter for immunization: Secondary | ICD-10-CM | POA: Insufficient documentation

## 2019-07-21 DIAGNOSIS — U071 COVID-19: Secondary | ICD-10-CM | POA: Insufficient documentation

## 2019-07-21 MED ORDER — SODIUM CHLORIDE 0.9 % IV SOLN
Freq: Once | INTRAVENOUS | Status: AC
  Filled 2019-07-21: qty 10

## 2019-07-21 MED ORDER — EPINEPHRINE 0.3 MG/0.3ML IJ SOAJ: 0.3000 mg | Freq: Once | INTRAMUSCULAR | Status: DC | PRN

## 2019-07-21 MED ORDER — FAMOTIDINE IN NACL 20-0.9 MG/50ML-% IV SOLN: 20.0000 mg | Freq: Once | INTRAVENOUS | Status: DC | PRN

## 2019-07-21 MED ORDER — METHYLPREDNISOLONE SODIUM SUCC 125 MG IJ SOLR: 125.0000 mg | Freq: Once | INTRAMUSCULAR | Status: DC | PRN

## 2019-07-21 MED ORDER — DIPHENHYDRAMINE HCL 50 MG/ML IJ SOLN: 50.0000 mg | Freq: Once | INTRAMUSCULAR | Status: DC | PRN

## 2019-07-21 MED ORDER — SODIUM CHLORIDE 0.9 % IV SOLN: INTRAVENOUS | Status: DC | PRN

## 2019-07-21 MED ORDER — ALBUTEROL SULFATE HFA 108 (90 BASE) MCG/ACT IN AERS: 2.0000 | INHALATION_SPRAY | Freq: Once | RESPIRATORY_TRACT | Status: DC | PRN

## 2019-07-21 NOTE — Discharge Instructions (Signed)

## 2019-07-21 NOTE — Progress Notes (Signed)
  Diagnosis: COVID-19  Physician:Dr Wright  Procedure: Covid Infusion Clinic Med: casirivimab\imdevimab infusion - Provided patient with casirivimab\imdevimab fact sheet for patients, parents and caregivers prior to infusion.  Complications: No immediate complications noted.  Discharge: Discharged home   Addi Pak W 07/21/2019  

## 2019-12-26 DIAGNOSIS — R195 Other fecal abnormalities: Secondary | ICD-10-CM | POA: Diagnosis not present

## 2019-12-26 DIAGNOSIS — R1013 Epigastric pain: Secondary | ICD-10-CM | POA: Diagnosis not present

## 2019-12-26 DIAGNOSIS — K30 Functional dyspepsia: Secondary | ICD-10-CM | POA: Diagnosis not present

## 2019-12-26 DIAGNOSIS — R9389 Abnormal findings on diagnostic imaging of other specified body structures: Secondary | ICD-10-CM | POA: Diagnosis not present

## 2019-12-26 DIAGNOSIS — N289 Disorder of kidney and ureter, unspecified: Secondary | ICD-10-CM | POA: Diagnosis not present

## 2019-12-26 DIAGNOSIS — J9811 Atelectasis: Secondary | ICD-10-CM | POA: Diagnosis not present

## 2019-12-26 DIAGNOSIS — I4519 Other right bundle-branch block: Secondary | ICD-10-CM | POA: Diagnosis not present

## 2019-12-26 DIAGNOSIS — K5732 Diverticulitis of large intestine without perforation or abscess without bleeding: Secondary | ICD-10-CM | POA: Diagnosis not present

## 2019-12-26 DIAGNOSIS — R0602 Shortness of breath: Secondary | ICD-10-CM | POA: Diagnosis not present

## 2019-12-29 DIAGNOSIS — I4510 Unspecified right bundle-branch block: Secondary | ICD-10-CM | POA: Diagnosis not present

## 2020-08-02 DIAGNOSIS — S2232XA Fracture of one rib, left side, initial encounter for closed fracture: Secondary | ICD-10-CM | POA: Diagnosis not present

## 2020-08-02 DIAGNOSIS — S2242XA Multiple fractures of ribs, left side, initial encounter for closed fracture: Secondary | ICD-10-CM | POA: Diagnosis not present

## 2020-08-02 DIAGNOSIS — W010XXA Fall on same level from slipping, tripping and stumbling without subsequent striking against object, initial encounter: Secondary | ICD-10-CM | POA: Diagnosis not present

## 2020-08-02 DIAGNOSIS — Y998 Other external cause status: Secondary | ICD-10-CM | POA: Diagnosis not present

## 2020-09-02 DIAGNOSIS — H52203 Unspecified astigmatism, bilateral: Secondary | ICD-10-CM | POA: Diagnosis not present

## 2020-09-02 DIAGNOSIS — H5213 Myopia, bilateral: Secondary | ICD-10-CM | POA: Diagnosis not present

## 2020-09-02 DIAGNOSIS — H47093 Other disorders of optic nerve, not elsewhere classified, bilateral: Secondary | ICD-10-CM | POA: Diagnosis not present

## 2020-09-02 DIAGNOSIS — H43393 Other vitreous opacities, bilateral: Secondary | ICD-10-CM | POA: Diagnosis not present

## 2020-09-02 DIAGNOSIS — H524 Presbyopia: Secondary | ICD-10-CM | POA: Diagnosis not present

## 2020-09-02 DIAGNOSIS — H2513 Age-related nuclear cataract, bilateral: Secondary | ICD-10-CM | POA: Diagnosis not present

## 2020-12-30 DIAGNOSIS — K219 Gastro-esophageal reflux disease without esophagitis: Secondary | ICD-10-CM | POA: Diagnosis not present

## 2020-12-30 DIAGNOSIS — R634 Abnormal weight loss: Secondary | ICD-10-CM | POA: Diagnosis not present

## 2021-01-04 DIAGNOSIS — R634 Abnormal weight loss: Secondary | ICD-10-CM | POA: Diagnosis not present

## 2021-01-04 DIAGNOSIS — K219 Gastro-esophageal reflux disease without esophagitis: Secondary | ICD-10-CM | POA: Diagnosis not present

## 2021-01-04 DIAGNOSIS — D649 Anemia, unspecified: Secondary | ICD-10-CM | POA: Diagnosis not present

## 2021-01-04 DIAGNOSIS — K5900 Constipation, unspecified: Secondary | ICD-10-CM | POA: Diagnosis not present

## 2021-03-29 DIAGNOSIS — Z1211 Encounter for screening for malignant neoplasm of colon: Secondary | ICD-10-CM | POA: Diagnosis not present

## 2021-03-29 DIAGNOSIS — R194 Change in bowel habit: Secondary | ICD-10-CM | POA: Diagnosis not present

## 2021-03-29 DIAGNOSIS — K317 Polyp of stomach and duodenum: Secondary | ICD-10-CM | POA: Diagnosis not present

## 2021-03-29 DIAGNOSIS — K648 Other hemorrhoids: Secondary | ICD-10-CM | POA: Diagnosis not present

## 2021-03-29 DIAGNOSIS — K449 Diaphragmatic hernia without obstruction or gangrene: Secondary | ICD-10-CM | POA: Diagnosis not present

## 2021-03-29 DIAGNOSIS — K635 Polyp of colon: Secondary | ICD-10-CM | POA: Diagnosis not present

## 2021-03-29 DIAGNOSIS — K5730 Diverticulosis of large intestine without perforation or abscess without bleeding: Secondary | ICD-10-CM | POA: Diagnosis not present

## 2021-03-29 DIAGNOSIS — K3189 Other diseases of stomach and duodenum: Secondary | ICD-10-CM | POA: Diagnosis not present

## 2021-03-29 DIAGNOSIS — K209 Esophagitis, unspecified without bleeding: Secondary | ICD-10-CM | POA: Diagnosis not present

## 2021-03-29 DIAGNOSIS — D122 Benign neoplasm of ascending colon: Secondary | ICD-10-CM | POA: Diagnosis not present

## 2021-03-29 DIAGNOSIS — R634 Abnormal weight loss: Secondary | ICD-10-CM | POA: Diagnosis not present

## 2021-03-29 DIAGNOSIS — K2100 Gastro-esophageal reflux disease with esophagitis, without bleeding: Secondary | ICD-10-CM | POA: Diagnosis not present

## 2021-03-29 DIAGNOSIS — K5792 Diverticulitis of intestine, part unspecified, without perforation or abscess without bleeding: Secondary | ICD-10-CM | POA: Diagnosis not present

## 2021-03-29 DIAGNOSIS — K319 Disease of stomach and duodenum, unspecified: Secondary | ICD-10-CM | POA: Diagnosis not present

## 2021-03-29 DIAGNOSIS — K2950 Unspecified chronic gastritis without bleeding: Secondary | ICD-10-CM | POA: Diagnosis not present

## 2021-04-24 DIAGNOSIS — R0981 Nasal congestion: Secondary | ICD-10-CM | POA: Diagnosis not present

## 2021-04-24 DIAGNOSIS — Z20822 Contact with and (suspected) exposure to covid-19: Secondary | ICD-10-CM | POA: Diagnosis not present

## 2021-04-24 DIAGNOSIS — R051 Acute cough: Secondary | ICD-10-CM | POA: Diagnosis not present

## 2021-04-24 DIAGNOSIS — J029 Acute pharyngitis, unspecified: Secondary | ICD-10-CM | POA: Diagnosis not present

## 2021-07-02 ENCOUNTER — Other Ambulatory Visit: Payer: Self-pay

## 2021-07-02 ENCOUNTER — Emergency Department (HOSPITAL_BASED_OUTPATIENT_CLINIC_OR_DEPARTMENT_OTHER)
Admission: EM | Admit: 2021-07-02 | Discharge: 2021-07-02 | Disposition: A | Discharge status: Home / Self Care | Payer: Medicare PPO | Attending: Emergency Medicine | Admitting: Emergency Medicine

## 2021-07-02 ENCOUNTER — Encounter (HOSPITAL_BASED_OUTPATIENT_CLINIC_OR_DEPARTMENT_OTHER): Payer: Self-pay | Admitting: *Deleted

## 2021-07-02 DIAGNOSIS — R000 Tachycardia, unspecified: Secondary | ICD-10-CM | POA: Diagnosis not present

## 2021-07-02 DIAGNOSIS — N3090 Cystitis, unspecified without hematuria: Secondary | ICD-10-CM | POA: Insufficient documentation

## 2021-07-02 DIAGNOSIS — Z20822 Contact with and (suspected) exposure to covid-19: Secondary | ICD-10-CM | POA: Insufficient documentation

## 2021-07-02 DIAGNOSIS — R509 Fever, unspecified: Secondary | ICD-10-CM | POA: Diagnosis present

## 2021-07-02 LAB — RESP PANEL BY RT-PCR (FLU A&B, COVID) ARPGX2
Influenza A by PCR: NEGATIVE
Influenza B by PCR: NEGATIVE
SARS Coronavirus 2 by RT PCR: NEGATIVE

## 2021-07-02 LAB — URINALYSIS, ROUTINE W REFLEX MICROSCOPIC
Bilirubin Urine: NEGATIVE
Glucose, UA: NEGATIVE mg/dL
Ketones, ur: NEGATIVE mg/dL
Nitrite: POSITIVE — AB
Protein, ur: NEGATIVE mg/dL
Specific Gravity, Urine: 1.02 (ref 1.005–1.030)
pH: 7 (ref 5.0–8.0)

## 2021-07-02 LAB — URINALYSIS, MICROSCOPIC (REFLEX): WBC, UA: >50 WBC/hpf (ref 0–5)

## 2021-07-02 MED ORDER — CEPHALEXIN 500 MG PO CAPS: 500.0000 mg | ORAL_CAPSULE | Freq: Four times a day (QID) | ORAL | 0 refills | Status: AC

## 2021-07-02 MED ORDER — PANTOPRAZOLE SODIUM 40 MG PO TBEC
40.0000 mg | DELAYED_RELEASE_TABLET | Freq: Once | ORAL | Status: AC
  Administered 2021-07-02: 40 mg via ORAL

## 2021-07-02 MED ORDER — CEPHALEXIN 250 MG PO CAPS
500.0000 mg | ORAL_CAPSULE | Freq: Once | ORAL | Status: AC
  Administered 2021-07-02: 500 mg via ORAL
  Filled 2021-07-02: qty 2

## 2021-07-02 NOTE — ED Triage Notes (Signed)
Pt reports fever today 101, slight headache and states "I keep burping". Requesting covid test

## 2021-07-02 NOTE — Discharge Instructions (Addendum)
Return to ED with any new or worsening symptoms such as fevers, nausea, vomiting, diarrhea Please take the antibiotic prescription I prescribed you.  You will need to take this medication 4 times daily for the next 7 days. If you begin to feel increasingly ill or worse than you have today, please report back to the ED

## 2021-07-02 NOTE — ED Notes (Signed)
EKG given to Dr. Pearline Cables for review

## 2021-07-02 NOTE — ED Provider Notes (Signed)
[facility] EMERGENCY DEPARTMENT Provider Note   CSN: [id number] Arrival date & time: 07/02/21  1901     History  Chief Complaint  Patient presents with   Fever    Shawn Armstrong is a 76 y.o. male with medical history significant for GERD.  Patient states that earlier today he was visiting his wife at their assisted rehab facility when he began having chills.  The patient states that he left at this time due to his fear that he would get others in the facility site.  Patient also adds that recently he has been urinating more, waking up in the middle the night to go to the bathroom.  Patient states that his PSA level has been checked in the past and it is usually within normal limits.  Patient continues that he is also burping more than usual.  He states that he has having some abdominal discomfort, states that he has been diagnosed with GERD in the past.  The patient reports that he has taken his GERD medication today, however he is still burping.  Patient endorses fevers, excessive urination.  Patient denies nausea, vomiting, diarrhea, abdominal pain.   Fever Associated symptoms: dysuria   Associated symptoms: no chest pain, no diarrhea, no nausea, no sore throat and no vomiting       Home Medications Prior to Admission medications   Medication Sig Start Date End Date Taking? Authorizing Provider  cephALEXin (KEFLEX) 500 MG capsule Take 1 capsule (500 mg total) by mouth 4 (four) times daily. 07/02/21  Yes Al DecantGroce, Virgen Belland F, PA-C      Allergies    Patient has no known allergies.    Review of Systems   Review of Systems  Constitutional:  Positive for fever.  HENT:  Negative for sore throat.   Respiratory:  Negative for shortness of breath.   Cardiovascular:  Negative for chest pain.  Gastrointestinal:  Negative for abdominal pain, diarrhea, nausea and vomiting.  Genitourinary:  Positive for dysuria and frequency. Negative for penile discharge, penile pain, penile  swelling and urgency.  All other systems reviewed and are negative.  Physical Exam Updated Vital Signs BP 138/88    Pulse 94    Temp 99.3 F (37.4 C) (Oral)    Resp (!) 22    Ht 6\' 2"  (1.88 m)    Wt 83.9 kg    SpO2 98%    BMI 23.75 kg/m  Physical Exam Vitals and nursing note reviewed.  Constitutional:      General: He is not in acute distress.    Appearance: Normal appearance. He is not ill-appearing, toxic-appearing or diaphoretic.  HENT:     Head: Normocephalic and atraumatic.     Mouth/Throat:     Mouth: Mucous membranes are moist.  Eyes:     Extraocular Movements: Extraocular movements intact.     Pupils: Pupils are equal, round, and reactive to light.  Cardiovascular:     Rate and Rhythm: Normal rate and regular rhythm.  Pulmonary:     Effort: Pulmonary effort is normal. No respiratory distress.     Breath sounds: Normal breath sounds. No stridor. No wheezing or rhonchi.  Abdominal:     General: Abdomen is flat.     Palpations: Abdomen is soft.     Tenderness: There is no abdominal tenderness.  Genitourinary:    Penis: Normal.      Testes: Normal. Cremasteric reflex is present.        Right: Tenderness or swelling  not present.        Left: Tenderness or swelling not present.     Prostate: Normal. Not enlarged and not tender.     Rectum: Normal.  Musculoskeletal:        General: Normal range of motion.     Cervical back: Normal range of motion and neck supple.  Skin:    General: Skin is warm and dry.     Capillary Refill: Capillary refill takes less than 2 seconds.  Neurological:     General: No focal deficit present.     Mental Status: He is alert.    ED Results / Procedures / Treatments   Labs (all labs ordered are listed, but only abnormal results are displayed) Labs Reviewed  URINALYSIS, ROUTINE W REFLEX MICROSCOPIC - Abnormal; Notable for the following components:      Result Value   APPearance CLOUDY (*)    Hgb urine dipstick MODERATE (*)    Nitrite  POSITIVE (*)    Leukocytes,Ua LARGE (*)    All other components within normal limits  URINALYSIS, MICROSCOPIC (REFLEX) - Abnormal; Notable for the following components:   Bacteria, UA MANY (*)    All other components within normal limits  RESP PANEL BY RT-PCR (FLU A&B, COVID) [id number]    EKG None  Radiology No results found.  Procedures Procedures    Medications Ordered in ED Medications  pantoprazole (PROTONIX) EC tablet 40 mg (40 mg Oral Given 07/02/21 1953)    ED Course/ Medical Decision Making/ A&P                           Medical Decision Making Amount and/or Complexity of Data Reviewed Labs: ordered.  Risk Prescription drug management.   76 year old male presents due to fever as well as "burping".  On examination, patient is febrile, not hypoxic, nontachycardic, nontoxic in appearance.  Patient is sitting upright speaking full sentences.  I treated this patient's "burping" with 20 mg Protonix.  The patient stated that after administration of the medication his burping subsided.  Labs ordered and reviewed by me include UA, respiratory panel Respiratory panel is negative for COVID, flu Patient UA is positive for nitrites.  Most likely cause of patient urinary frequency and fever at this point seems to be cystitis.  I will prescribe this patient Keflex 500 mg 4 times daily for the next 7 days.  I have discussed this patient and his case with Dr. Wallace Cullens, who is in agreement with the plan to discharge the patient.  We had a lengthy discussion whether or not we felt this patient required admission for possible sepsis work-up.  At this point, we do not feel strongly that the patient requires admission.  The patient is requesting not to be admitted due to the fact that he wants to visit his wife in the rehab facility.  I provided this patient with strict return precautions and he voiced understanding.  I advised the patient that he will need to be extremely aware of any excessive  fever, nausea, vomiting, diarrhea that he experiences the next few days.  The patient had all of his questions answered to his satisfaction.  The patient is stable on discharge.   Final Clinical Impression(s) / ED Diagnoses Final diagnoses:  Cystitis    Rx / DC Orders ED Discharge Orders          Ordered    cephALEXin (KEFLEX) 500 MG capsule  4 times daily  07/02/21 2105              Al Decant, PA-C 07/02/21 2119    Franne Forts, DO 07/05/21 1148

## 2021-07-04 DIAGNOSIS — B9629 Other Escherichia coli [E. coli] as the cause of diseases classified elsewhere: Secondary | ICD-10-CM | POA: Diagnosis not present

## 2021-07-04 DIAGNOSIS — I959 Hypotension, unspecified: Secondary | ICD-10-CM | POA: Diagnosis not present

## 2021-07-04 DIAGNOSIS — A4151 Sepsis due to Escherichia coli [E. coli]: Secondary | ICD-10-CM | POA: Diagnosis not present

## 2021-07-04 DIAGNOSIS — R000 Tachycardia, unspecified: Secondary | ICD-10-CM | POA: Diagnosis not present

## 2021-07-04 DIAGNOSIS — N390 Urinary tract infection, site not specified: Secondary | ICD-10-CM | POA: Diagnosis not present

## 2021-07-04 DIAGNOSIS — Z20822 Contact with and (suspected) exposure to covid-19: Secondary | ICD-10-CM | POA: Diagnosis not present

## 2021-07-04 DIAGNOSIS — A419 Sepsis, unspecified organism: Secondary | ICD-10-CM | POA: Diagnosis not present

## 2021-07-04 DIAGNOSIS — E538 Deficiency of other specified B group vitamins: Secondary | ICD-10-CM | POA: Diagnosis not present

## 2021-07-04 DIAGNOSIS — R531 Weakness: Secondary | ICD-10-CM | POA: Diagnosis not present

## 2021-07-04 DIAGNOSIS — N3090 Cystitis, unspecified without hematuria: Secondary | ICD-10-CM | POA: Diagnosis not present

## 2021-07-04 DIAGNOSIS — R509 Fever, unspecified: Secondary | ICD-10-CM | POA: Diagnosis not present

## 2021-07-04 DIAGNOSIS — M6281 Muscle weakness (generalized): Secondary | ICD-10-CM | POA: Diagnosis not present

## 2021-07-04 DIAGNOSIS — D649 Anemia, unspecified: Secondary | ICD-10-CM | POA: Diagnosis not present

## 2021-07-04 DIAGNOSIS — N1832 Chronic kidney disease, stage 3b: Secondary | ICD-10-CM | POA: Diagnosis not present

## 2021-07-05 LAB — URINE CULTURE: Culture: >=100000 — AB

## 2021-07-06 ENCOUNTER — Telehealth: Payer: Self-pay | Admitting: Emergency Medicine

## 2021-07-06 ENCOUNTER — Telehealth: Payer: Self-pay

## 2021-07-06 NOTE — Telephone Encounter (Signed)
Post ED Visit - Positive Culture Follow-up  Culture report reviewed by antimicrobial stewardship pharmacist: [place] Team []  Elenor Quinones, Pharm.D. []  Heide Guile, Pharm.D., BCPS AQ-ID []  Parks Neptune, Pharm.D., BCPS []  Alycia Rossetti, Pharm.D., BCPS []  [place], Pharm.D., BCPS, AAHIVP []  Legrand Como, Pharm.D., BCPS, AAHIVP []  Salome Arnt, PharmD, BCPS []  Johnnette Gourd, PharmD, BCPS []  Hughes Better, PharmD, BCPS []  Leeroy Cha, PharmD []  Laqueta Linden, PharmD, BCPS []  Albertina Parr, PharmD  [place] Team []  Leodis Sias, PharmD []  Lindell Spar, PharmD []  Royetta Asal, PharmD []  Graylin Shiver, Rph []  Rema Fendt) [name] Mac, PharmD []  Arlyn Dunning, PharmD []  Netta Cedars, PharmD []  Dia Sitter, PharmD []  Leone Haven, PharmD []  Gretta Arab, PharmD []  Theodis Shove, PharmD []  Peggyann Juba, PharmD []  Reuel Boom, PharmD   Positive urine culture Treated with cephalexin, organism sensitive to the same and no further patient follow-up is required at this time.  Hazle Nordmann 07/06/2021, 9:39 AM

## 2021-07-06 NOTE — Telephone Encounter (Signed)
Post ED Visit - Positive Culture Follow-up  Culture report reviewed by antimicrobial stewardship pharmacist: [facility] []  , Pharm.D. []  Enzo Bi, Pharm.D., BCPS AQ-ID []  , Pharm.D., BCPS []  Celedonio Miyamoto, Pharm.D., BCPS []  [place], Garvin Fila.D., BCPS, AAHIVP []  , Pharm.D., BCPS, AAHIVP [x]  Georgina Pillion, PharmD, BCPS []  , PharmD, BCPS []  [place], PharmD, BCPS []  [address], PharmD []  , PharmD, BCPS []  Estella Husk, PharmD  Pharmacy Team []  Lysle Pearl, PharmD []  , PharmD []  Phillips Climes, PharmD []  , Rph []  Agapito Games) , PharmD []  Verlan Friends, PharmD []  , PharmD []  Mervyn Gay, PharmD []  , PharmD []  Vinnie Level, PharmD []  [facility], PharmD []  , PharmD []  Len Childs, PharmD   Positive urine culture Treated with Cephalexin, organism sensitive to the same and no further patient follow-up is required at this time.  07/06/2021, 9:29 AM

## 2022-01-12 DIAGNOSIS — Y999 Unspecified external cause status: Secondary | ICD-10-CM | POA: Diagnosis not present

## 2022-01-12 DIAGNOSIS — S80812A Abrasion, left lower leg, initial encounter: Secondary | ICD-10-CM | POA: Diagnosis not present

## 2022-01-12 DIAGNOSIS — X58XXXA Exposure to other specified factors, initial encounter: Secondary | ICD-10-CM | POA: Diagnosis not present

## 2022-02-01 ENCOUNTER — Other Ambulatory Visit: Payer: Self-pay

## 2022-02-01 NOTE — Patient Outreach (Signed)
[facility]) Care Management  02/01/2022  Shawn Armstrong [birth year] [id number]   Telephone call to patient for nurse call. Male answered. She asked who was calling. CM explained reason for call.  She hung up.   Plan: RN CM will attempt again within 4 business days and send letter.  Bary Leriche, RN, MSN [facility] Care Management Care Management Coordinator Direct Line [phone] Toll Free: [phone]  Fax: [phone]

## 2022-02-03 ENCOUNTER — Other Ambulatory Visit: Payer: Self-pay

## 2022-02-03 NOTE — Patient Outreach (Signed)
[facility]) Care Management  02/03/2022  Shawn Armstrong 06/16/1945 [id number]   Telephone call to patient for nurse call.  Patient declined to talk with CM.    Plan: RN CM will close case.    Bary Leriche, RN, MSN [facility] Care Management Care Management Coordinator Direct Line [phone] Toll Free: [phone]  Fax: [phone]

## 2022-11-21 DIAGNOSIS — U071 COVID-19: Secondary | ICD-10-CM | POA: Diagnosis not present
